# Patient Record
Sex: Female | Born: 1955 | Race: White | Hispanic: No | Marital: Married | Smoking: Never smoker
Health system: Southern US, Community
[De-identification: ages and names within clinical notes are randomized; demographics above are authoritative.]

## PROBLEM LIST (undated history)

## (undated) DIAGNOSIS — Z9889 Other specified postprocedural states: Secondary | ICD-10-CM

## (undated) DIAGNOSIS — K219 Gastro-esophageal reflux disease without esophagitis: Secondary | ICD-10-CM

## (undated) DIAGNOSIS — J45909 Unspecified asthma, uncomplicated: Secondary | ICD-10-CM

## (undated) DIAGNOSIS — R112 Nausea with vomiting, unspecified: Secondary | ICD-10-CM

## (undated) HISTORY — PX: ABDOMINAL HYSTERECTOMY: SHX81

## (undated) HISTORY — PX: KNEE SURGERY: SHX244

---

## 1999-08-16 ENCOUNTER — Other Ambulatory Visit: Admission: RE | Admit: 1999-08-16 | Discharge: 1999-08-16 | Payer: Self-pay | Admitting: Obstetrics and Gynecology

## 2000-11-25 ENCOUNTER — Encounter: Admission: RE | Admit: 2000-11-25 | Discharge: 2000-11-25 | Payer: Self-pay | Admitting: Obstetrics and Gynecology

## 2000-11-25 ENCOUNTER — Encounter: Payer: Self-pay | Admitting: Obstetrics and Gynecology

## 2000-11-27 ENCOUNTER — Other Ambulatory Visit: Admission: RE | Admit: 2000-11-27 | Discharge: 2000-11-27 | Payer: Self-pay | Admitting: Obstetrics and Gynecology

## 2001-01-06 ENCOUNTER — Encounter: Payer: Self-pay | Admitting: Obstetrics and Gynecology

## 2001-01-06 ENCOUNTER — Encounter: Admission: RE | Admit: 2001-01-06 | Discharge: 2001-01-06 | Payer: Self-pay | Admitting: Obstetrics and Gynecology

## 2003-05-29 ENCOUNTER — Encounter: Admission: RE | Admit: 2003-05-29 | Discharge: 2003-05-29 | Payer: Self-pay | Admitting: Obstetrics and Gynecology

## 2003-05-29 ENCOUNTER — Encounter: Payer: Self-pay | Admitting: Obstetrics and Gynecology

## 2004-09-09 ENCOUNTER — Ambulatory Visit (HOSPITAL_COMMUNITY): Admission: RE | Admit: 2004-09-09 | Discharge: 2004-09-09 | Payer: Self-pay | Admitting: Internal Medicine

## 2004-10-04 ENCOUNTER — Encounter: Admission: RE | Admit: 2004-10-04 | Discharge: 2004-10-04 | Payer: Self-pay | Admitting: Internal Medicine

## 2004-12-10 ENCOUNTER — Ambulatory Visit (HOSPITAL_COMMUNITY): Admission: RE | Admit: 2004-12-10 | Discharge: 2004-12-10 | Payer: Self-pay | Admitting: Gastroenterology

## 2005-10-31 ENCOUNTER — Encounter: Admission: RE | Admit: 2005-10-31 | Discharge: 2005-10-31 | Payer: Self-pay | Admitting: Internal Medicine

## 2006-10-26 ENCOUNTER — Encounter: Payer: Self-pay | Admitting: Vascular Surgery

## 2006-10-26 ENCOUNTER — Ambulatory Visit (HOSPITAL_COMMUNITY): Admission: RE | Admit: 2006-10-26 | Discharge: 2006-10-26 | Payer: Self-pay | Admitting: Specialist

## 2007-02-22 ENCOUNTER — Encounter: Admission: RE | Admit: 2007-02-22 | Discharge: 2007-02-22 | Payer: Self-pay | Admitting: Internal Medicine

## 2007-03-03 ENCOUNTER — Encounter: Admission: RE | Admit: 2007-03-03 | Discharge: 2007-03-03 | Payer: Self-pay | Admitting: Internal Medicine

## 2008-03-27 ENCOUNTER — Encounter: Admission: RE | Admit: 2008-03-27 | Discharge: 2008-03-27 | Payer: Self-pay | Admitting: Internal Medicine

## 2010-02-15 ENCOUNTER — Encounter: Admission: RE | Admit: 2010-02-15 | Discharge: 2010-02-15 | Payer: Self-pay | Admitting: Obstetrics and Gynecology

## 2010-02-28 ENCOUNTER — Encounter: Payer: Self-pay | Admitting: Internal Medicine

## 2010-03-04 ENCOUNTER — Ambulatory Visit: Payer: Self-pay | Admitting: Internal Medicine

## 2010-03-04 DIAGNOSIS — R05 Cough: Secondary | ICD-10-CM | POA: Insufficient documentation

## 2010-03-04 DIAGNOSIS — R059 Cough, unspecified: Secondary | ICD-10-CM | POA: Insufficient documentation

## 2010-03-04 LAB — CONVERTED CEMR LAB: IgE (Immunoglobulin E), Serum: 11.9 [IU]/mL (ref 0.0–180.0)

## 2010-03-05 ENCOUNTER — Telehealth (INDEPENDENT_AMBULATORY_CARE_PROVIDER_SITE_OTHER): Payer: Self-pay | Admitting: *Deleted

## 2010-03-19 ENCOUNTER — Ambulatory Visit: Payer: Self-pay | Admitting: Internal Medicine

## 2010-06-19 ENCOUNTER — Encounter: Admission: RE | Admit: 2010-06-19 | Discharge: 2010-06-19 | Payer: Self-pay | Admitting: Gastroenterology

## 2010-12-22 ENCOUNTER — Encounter: Payer: Self-pay | Admitting: Internal Medicine

## 2011-01-02 NOTE — Progress Notes (Signed)
Summary: instructions on Prilosec    [id number]  Phone Note Call from Patient Call back at Work Phone [phone]   Caller: Patient Call For: wert Reason for Call: Talk to Nurse Summary of Call: on pt's instructions, prilosec otc reads to take puffs, please clarify for pt.  pt goes by [facility] Initial call taken by: Eugene Gavia,  March 05, 2010 9:51 AM  Follow-up for Phone Call        pt was just seen by MW yesterday.  Per EMR, pt's current med list state Prilosec OTC 20mg   2 puffs first thing in the am and 2 puffs again in the pm about 12 hours later.  Please advise.  Thanks.  Aundra Millet Reynolds LPN  March 05, 980 10:02 AM   Pt requesting for [place] to call her back at work at [phone] or cell [phone].  Gweneth Dimitri RN  March 05, 2010 11:21 AM sorry, wrong quicktext!  should read Take one 30-60 min before first and last meals of the day   Follow-up by: Nyoka Cowden MD,  March 05, 2010 11:56 AM  Additional Follow-up for Phone Call Additional follow up Details #1::        ATC pt at work #.  Line busy.  LM on cell # TCB.  Aundra Millet Reynolds LPN  March 05, 9561 2:25 PM   called and spoke with pt and informed her of the correct directions of her prilosec.  pt verbalized understanding and denied any questions.  i have updated pt's med list to refect this change. Aundra Millet Reynolds LPN  March 06, 1307 4:09 PM     New/Updated Medications: PRILOSEC OTC 20 MG TBEC (OMEPRAZOLE MAGNESIUM) take 1 tab by mouth two times a day 30 to 60 minutes before first and last meal of the day.

## 2011-01-02 NOTE — Assessment & Plan Note (Signed)
Summary: Pulmonayr/ f/u cough, add 1st gen H1   Copy to:  Dr. Renford Dills Primary Provider/Referring Provider:  Dr. Renford Dills   CC:  2 wk followup.  Pt states that her cough is at least 98% better.  She only has occ dry cough.  No new complaints today.Wendy Cain  History of Present Illness: 55 yowf never smoker with h/o seasonal itching sneezing runny eyes spring = fall with pattern worsening since around 1995 assoc with cough/ wheezing and sob with periods feeling better  in between on no medicines but finds zyrtec helps.  When flares typically to prednisone and abx but always feels a little ticke or fullness in the throat since 2002 or so.  March 04, 2010 cc cough much worse than baseline  since early March 2011 ? related to cigarettes  rx with abx, then prednisone helped some then started Advair x 1 week.  Fatigue. Did have nasty mucus now does not. more day than night  and barking quality cough with midline pain during severe fits only and takes her breath away but not gagging or vomiting. Advair makes her cough worse.  March 19, 2010 2 wk followup.  Pt states that her cough is at least 98% better.  She only has occ dry cough.  No new complaints today. tickle went away with prednisone and now back while taking prilosec two times a day and not pepcid and using zyrtec.  more noticeable lying down. Pt denies any significant sore throat, dysphagia, itching, sneezing,  nasal congestion or excess secretions,  fever, chills, sweats, unintended wt loss, pleuritic or exertional cp, hempoptysis, change in activity tolerance  orthopnea pnd or leg swelling Pt also denies any obvious fluctuation in symptoms with weather or environmental change or other alleviating or aggravating factors.           Current Medications (verified): 1)  Ventolin Hfa 108 (90 Base) Mcg/act Aers (Albuterol Sulfate) .Wendy Cain.. 1 Puff Two Times A Day 2)  Prilosec Otc 20 Mg Tbec (Omeprazole Magnesium) .... Take 1 Tab By Mouth Two Times A  Day 30 To 60 Minutes Before First and Last Meal of The Day. 3)  Tramadol Hcl 50 Mg  Tabs (Tramadol Hcl) .... One To Two By Mouth Every 4-6 Hours As Needed  Allergies (verified): No Known Drug Allergies  Past History:  Past Medical History: COUGH (ICD-786.2)..............................Wendy KitchenWert     - onset around 2002     - allergy profile neg 03/04/10  Vital Signs:  Patient profile:   55 year old female Weight:      136.13 pounds O2 Sat:      97 % on Room air Temp:     98.2 degrees F oral Pulse rate:   78 / minute BP sitting:   140 / 74  (left arm)  Vitals Entered By: Vernie Murders (March 19, 2010 10:09 AM)  O2 Flow:  Room air  Physical Exam  Additional Exam:  pleasant amb wf with occ throat clearing wt 136 March 04, 2010  > 136 March 19, 2010  HEENT: nl dentition, turbinates, and orophanx. Nl external ear canals without cough reflex NECK :  without JVD/Nodes/TM/ nl carotid upstrokes bilaterally LUNGS: no acc muscle use, clear to A and P bilaterally without cough on insp or exp maneuvers CV:  RRR  no s3 or murmur or increase in P2, no edema  ABD:  soft and nontender with nl excursion in the supine position. No bruits or organomegaly, bowel sounds nl  MS:  warm without deformities, calf tenderness, cyanosis or clubbing    Impression & Recommendations:  Problem # 1:  COUGH (ICD-786.2)  The most common causes of chronic cough in immunocompetent adults include: upper airway cough syndrome (UACS), previously referred to as postnasal drip syndrome,  caused by variety of rhinosinus conditions; (2) asthma; (3) GERD; (4) chronic bronchitis from cigarette smoking or other inhaled environmental irritants; (5) nonasthmatic eosinophilic bronchitis; and (6) bronchiectasis. These conditions, singly or in combination, have accounted for up to 94% of the causes of chronic cough in prospective studies.   The standardized cough guidelines recently published in Chest are a 14 step process, not a  single office visit,  and are intended  to address this problem logically,  with an alogrithm dependent on response to each progressive step  to determine a specific diagnosis with  minimal addtional testing needed. Therefore if compliance is an issue this empiric standardized approach simply won't work.  next step is add H1 first gen and work harder on acid suppression, next step Methacholine while on max acid rx.  Orders: Est. Patient Level III ([address])  Medications Added to Medication List This Visit: 1)  Prednisone 10 Mg Tabs (Prednisone) .... 4 each am x 2days, 2x2days, 1x2days and stop 2)  Ventolin Hfa 108 (90 Base) Mcg/act Aers (Albuterol sulfate) .... 2 puffs every 4 hours if needed for short of breath  Patient Instructions: 1)  GERD (REFLUX)  is a common cause of respiratory symptoms. It commonly presents without heartburn and can be treated with medication, but also with lifestyle changes including avoidance of late meals, excessive alcohol, smoking cessation, and avoid fatty foods, chocolate, peppermint, colas, red wine, and acidic juices such as orange juice. NO MINT OR MENTHOL PRODUCTS SO NO COUGH DROPS  2)  USE SUGARLESS CANDY INSTEAD (jolley ranchers)  3)  NO OIL BASED VITAMINS 4)  Acid reflux is a leading suspect here and needs to be eliminated  completely before considering additional studies or treatment options. To suppress this maximally, take Prilosec  before first and last meal and pepcid 20 mg (otc) at bedtime plus diet measures as listed.  5)  Prednisone  4 each am x 2days, 2x2days, 1x2days and stop  6)  Chortrimeton 4mg  every 6 hours as needed for tickle itching sneezing drainage 7)  Take delsym two tsp every 12 hours and add tramadol 50 mg up to every 4 hours to suppress the urge to cough. Swallowing water or using ice chips/non mint and menthol containing candies (such as lifesavers or sugarless jolly ranchers) are also effective.  8)  Please schedule a follow-up  appointment in 2 weeks, sooner if needed  Prescriptions: PREDNISONE 10 MG  TABS (PREDNISONE) 4 each am x 2days, 2x2days, 1x2days and stop  #14 x 0   Entered and Authorized by:   Nyoka Cowden MD   Signed by:   Nyoka Cowden MD on 03/19/2010   Method used:   Electronically to        [facility] [address] [id number]* (retail)       [address] [place], [place]  [address]       Ph: [phone]       Fax: [phone]   RxID:   [id number]

## 2011-01-02 NOTE — Assessment & Plan Note (Signed)
Summary: Pulmonary consulation/ cough c/w UACS/GERD   Visit Type:  Initial Consult Copy to:  Dr. Renford Dills Primary Provider/Referring Provider:  Dr. Renford Dills   CC:  Cough.  History of Present Illness: 55 yowf never smoker with h/o seasonal itching sneezing runny eyes spring = fall with pattern worsening since around 1995 assoc with cough/ wheezing and sob with periods feeling better  in between on no medicines but finds zyrtec helps.  When flares typically to prednisone and abx but always feels a little ticke or fullness in the throat since 2002 or so.  March 04, 2010 cc cough much worse than baseline  since early March 16109 ? related to cigarettes  rx with abx, then prednisone helped some then started Advair x 1 week.  Fatigue. Did have nasty mucus now does not. more day than night  and barking quality cough with midline pain during severe fits only and takes her breath away but not gagging or vomiting. Advair makes her cough worse.    Pt presently denies any significant sore throat, dysphagia, itching, sneezing,  nasal congestion or excess secretions,  fever, chills, sweats, unintended wt loss, pleuritic or exertional cp, hempoptysis, change in activity tolerance  orthopnea pnd or leg swelling Pt also denies any obvious fluctuation in symptoms with weather or environmental change or other alleviating or aggravating factors.        Current Medications (verified): 1)  Ventolin Hfa 108 (90 Base) Mcg/act Aers (Albuterol Sulfate) .Marland Kitchen.. 1 Puff Two Times A Day 2)  Advair Diskus 100-50 Mcg/dose Aepb (Fluticasone-Salmeterol) .Marland Kitchen.. 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  The patient denies productive cough, coughing up blood, irregular heartbeats, acid heartburn, indigestion, loss of appetite, weight change, abdominal pain, difficulty swallowing, sore throat, tooth/dental problems, nasal congestion/difficulty breathing through nose, sneezing, itching, ear ache, anxiety, depression, hand/feet swelling, joint stiffness or pain, rash, change in color of mucus, and fever.    Vital Signs:  Patient profile:   55 year old female Height:      64 inches Weight:      136 pounds BMI:     23.43 O2 Sat:      98 % on Room air Temp:     98.1 degrees F oral Pulse rate:   73 / minute BP sitting:   114 / 66  (left arm)  Vitals Entered By: Vernie Murders (March 04, 2010 9:59 AM)  O2 Flow:  Room air  Physical Exam  Additional Exam:  pleasant amb wf with barking/honking quality upper airway cough wt 136 March 04, 2010 HEENT: nl dentition, turbinates, and orophanx. Nl external ear canals without cough reflex NECK :  without JVD/Nodes/TM/ nl carotid upstrokes bilaterally LUNGS: no acc muscle use, clear to A and P bilaterally without cough on insp or exp maneuvers CV:  RRR  no s3 or murmur or increase in P2, no edema  ABD:  soft and nontender with nl excursion in the supine position. No bruits or organomegaly, bowel sounds nl MS:  warm without  deformities, calf tenderness, cyanosis or clubbing SKIN: warm and dry without lesions   NEURO:  alert, approp, no deficits     Impression & Recommendations:  Problem # 1:  COUGH (ICD-786.2)  Orders: T-Allergy Profile [facility], [place], [place], [place], [place] [phone]) Consultation Level V [phone])  The most common causes of chronic cough in immunocompetent adults include: upper airway cough syndrome (UACS), previously referred to as postnasal drip syndrome,  caused by variety  of rhinosinus conditions; (2) asthma; (3) GERD; (4) chronic bronchitis from cigarette smoking or other inhaled environmental irritants; (5) nonasthmatic eosinophilic bronchitis; and (6) bronchiectasis. These conditions, singly or in combination, have accounted for up to 94% of the causes of chronic cough in prospective studies.   Of the three most common causes of chronic cough, only one (GERD)  can actually cause the other two (asthma and uacs)  and perpetuate the cylce of cough inducing airway trauma, inflammation, heightened sensitivity to reflux which is prompted by the cough itself via a cyclical mechanism.  This may partially respond to steroids and look like asthma and post nasal drainage but never erradicated completely unless the cough and the secondary reflux are eliminated, preferably both at the same time. See instructions for specific recommendations   The standardized cough guidelines recently published in Chest are a 14 step process, not a single office visit,  and are intended  to address this problem logically,  with an alogrithm dependent on response to each progressive step  to determine a specific diagnosis with  minimal addtional testing needed. Therefore if compliance is an issue this empiric standardized approach simply won't work.   NB the  ramp to expected improvement (and for that matter, worsening, if a chronic effective medication is stopped)  can be measured in weeks, not days, a common misconception because this is not Heartburn with no immediate cause and effect relationship so that response to therapy or lack thereof can be very difficult to assess.   Medications Added to Medication List This Visit: 1)  Ventolin Hfa 108 (90 Base) Mcg/act Aers (Albuterol sulfate) .Marland Kitchen.. 1 puff two times a day 2)  Advair Diskus 100-50 Mcg/dose Aepb (Fluticasone-salmeterol) .Marland Kitchen.. 1 puff two times a day 3)  Prilosec Otc 20 Mg Tbec (Omeprazole magnesium) .... 2 puffs first thing  in am and 2 puffs  again in pm about 12 hours later 4)  Prednisone 10 Mg Tabs (Prednisone) .... 4 each am x 2days, 2x2days, 1x2days and stop 5)  Tramadol Hcl 50 Mg Tabs (Tramadol hcl) .... One to two by mouth every 4-6 hours as needed  Patient Instructions: 1)  GERD (REFLUX)  is a common cause of respiratory symptoms. It commonly presents without heartburn and can be treated with medication, but also with lifestyle changes including avoidance of late meals, excessive alcohol, smoking cessation, and avoid fatty foods, chocolate, peppermint, colas, red wine, and acidic juices such as orange juice. NO MINT OR MENTHOL PRODUCTS SO NO COUGH DROPS  2)  USE SUGARLESS CANDY INSTEAD (jolley ranchers)  3)  NO OIL BASED VITAMINS 4)  Acid reflux is a leading suspect here and needs to be eliminated  completely before considering additional studies or treatment options. To suppress this maximally, take Prilosec  before first and last meal and pepcid 20 mg (otc) at bedtime plus diet measures as listed.  5)  Stop advair 6)  Prednisone  4 each am x 2days, 2x2days, 1x2days and stop  7)  Take delsym two tsp every 12 hours  and add tramadol 50 mg up to every 4 hours to suppress the urge to cough. Swallowing water or using ice chips/non mint and menthol containing candies (such as lifesavers or sugarless jolly ranchers) are also effective.  8)  Please schedule a follow-up appointment in 2 weeks, sooner if needed  Prescriptions: TRAMADOL HCL 50 MG  TABS (TRAMADOL HCL) One to two by mouth every 4-6 hours as needed  #40 x 0   Entered and Authorized by:   Nyoka Cowden MD   Signed by:   Nyoka Cowden MD on 03/04/2010   Method used:   Electronically to        [facility] [id number]* (retail)       [address] [place], [place]  [address]       Ph: [phone]       Fax: [phone]   RxID:   [id number] PREDNISONE 10 MG  TABS (PREDNISONE) 4 each am x 2days, 2x2days, 1x2days and stop  #14 x 0   Entered and Authorized  by:   Nyoka Cowden MD   Signed by:   Nyoka Cowden MD on 03/04/2010   Method used:   Electronically to        [facility] [id number]* (retail)       [address] [place], [place]  [address]       Ph: [phone]       Fax: [phone]   RxID:   [id number]   Appended Document: Pulmonary consulation/ cough c/w UACS/GERD reported cxr at [facility] radiology nl w/in past 6 weeks but not in system

## 2011-01-31 ENCOUNTER — Other Ambulatory Visit: Payer: Self-pay | Admitting: Obstetrics and Gynecology

## 2011-01-31 DIAGNOSIS — Z1239 Encounter for other screening for malignant neoplasm of breast: Secondary | ICD-10-CM

## 2011-02-18 ENCOUNTER — Ambulatory Visit
Admission: RE | Admit: 2011-02-18 | Discharge: 2011-02-18 | Disposition: A | Discharge status: Home / Self Care | Payer: 59 | Source: Ambulatory Visit | Attending: Obstetrics and Gynecology | Admitting: Obstetrics and Gynecology

## 2011-02-18 DIAGNOSIS — Z1239 Encounter for other screening for malignant neoplasm of breast: Secondary | ICD-10-CM

## 2011-04-18 NOTE — Op Note (Signed)
NAME:  Wendy Cain, Wendy Cain                ACCOUNT NO.:  [id number]   MEDICAL RECORD NO.:  [id number]          PATIENT TYPE:  AMB   LOCATION:  ENDO                         FACILITY:  [facility]   PHYSICIAN:  Danise Edge, M.D.   DATE OF BIRTH:  1956-03-29   DATE OF PROCEDURE:  12/10/2004  DATE OF DISCHARGE:                                 OPERATIVE REPORT   PROCEDURE:  Screening colonoscopy.   INDICATIONS FOR PROCEDURE:  Ms. [name] is a  55 year old female born  [birth year].  Ms. [name] is scheduled to undergo her first screening  colonoscopy with polypectomy to prevent colon cancer.   ENDOSCOPIST:  Danise Edge, M.D.   PREMEDICATION:  Versed 5 mg, Demerol 50 mg.   DESCRIPTION OF PROCEDURE:  After obtaining informed consent, Ms. [name] was  placed in the left lateral decubitus position.  I administered intravenous  Demerol and intravenous Versed to achieve conscious sedation for the  procedure.  The patient's blood pressure, oxygen saturation, and cardiac  rhythm were monitored throughout the procedure and documented in the medical  record.   Anal inspection and digital rectal exam were normal.  The Olympus adjustable  pediatric colonoscope was introduced into the rectum and advanced to the  cecum.  The colonic preparation for the exam today was excellent.   Rectum:  Normal.   Sigmoid colon and descending colon:  Normal.   Splenic flexure:  Normal.   Transverse colon:  Normal.   Hepatic flexure:  Normal.   Ascending colon:  Normal.   Cecum and ileocecal valve:  Normal.   ASSESSMENT:  Normal screening proctocolonoscopy to the cecum.  No endoscopic  evidence for the presence of colorectal neoplasia.      MJ/MEDQ  D:  12/10/2004  T:  12/10/2004  Job:  [id number]   cc:   Ike Bene, M.D.  [address]  [place]  [place] [address]  Fax: [phone]

## 2012-01-13 ENCOUNTER — Other Ambulatory Visit: Payer: Self-pay | Admitting: Obstetrics and Gynecology

## 2012-01-13 DIAGNOSIS — Z1231 Encounter for screening mammogram for malignant neoplasm of breast: Secondary | ICD-10-CM

## 2012-02-27 ENCOUNTER — Ambulatory Visit: Payer: 59

## 2012-02-27 ENCOUNTER — Ambulatory Visit
Admission: RE | Admit: 2012-02-27 | Discharge: 2012-02-27 | Disposition: A | Discharge status: Home / Self Care | Payer: 59 | Source: Ambulatory Visit | Attending: Obstetrics and Gynecology | Admitting: Obstetrics and Gynecology

## 2012-02-27 DIAGNOSIS — Z1231 Encounter for screening mammogram for malignant neoplasm of breast: Secondary | ICD-10-CM

## 2012-03-01 ENCOUNTER — Ambulatory Visit: Payer: 59

## 2012-11-01 ENCOUNTER — Other Ambulatory Visit: Payer: Self-pay | Admitting: Obstetrics and Gynecology

## 2012-11-01 DIAGNOSIS — M8580 Other specified disorders of bone density and structure, unspecified site: Secondary | ICD-10-CM

## 2013-03-04 ENCOUNTER — Other Ambulatory Visit: Payer: Self-pay

## 2013-03-04 ENCOUNTER — Other Ambulatory Visit: Payer: Self-pay | Admitting: Obstetrics and Gynecology

## 2013-03-04 DIAGNOSIS — M8580 Other specified disorders of bone density and structure, unspecified site: Secondary | ICD-10-CM

## 2013-03-04 DIAGNOSIS — Z1231 Encounter for screening mammogram for malignant neoplasm of breast: Secondary | ICD-10-CM

## 2013-04-15 ENCOUNTER — Ambulatory Visit
Admission: RE | Admit: 2013-04-15 | Discharge: 2013-04-15 | Disposition: A | Discharge status: Home / Self Care | Payer: BC Managed Care – PPO | Source: Ambulatory Visit | Attending: Obstetrics and Gynecology | Admitting: Obstetrics and Gynecology

## 2013-04-15 ENCOUNTER — Ambulatory Visit
Admission: RE | Admit: 2013-04-15 | Discharge: 2013-04-15 | Disposition: A | Discharge status: Home / Self Care | Payer: BC Managed Care – PPO | Source: Ambulatory Visit

## 2013-04-15 DIAGNOSIS — M8580 Other specified disorders of bone density and structure, unspecified site: Secondary | ICD-10-CM

## 2013-04-15 DIAGNOSIS — Z1231 Encounter for screening mammogram for malignant neoplasm of breast: Secondary | ICD-10-CM

## 2014-04-14 ENCOUNTER — Other Ambulatory Visit: Payer: Self-pay

## 2014-04-14 DIAGNOSIS — Z1231 Encounter for screening mammogram for malignant neoplasm of breast: Secondary | ICD-10-CM

## 2014-04-25 ENCOUNTER — Encounter (INDEPENDENT_AMBULATORY_CARE_PROVIDER_SITE_OTHER): Payer: Self-pay

## 2014-04-25 ENCOUNTER — Ambulatory Visit
Admission: RE | Admit: 2014-04-25 | Discharge: 2014-04-25 | Disposition: A | Discharge status: Home / Self Care | Payer: BC Managed Care – PPO | Source: Ambulatory Visit

## 2014-04-25 DIAGNOSIS — Z1231 Encounter for screening mammogram for malignant neoplasm of breast: Secondary | ICD-10-CM

## 2015-04-06 ENCOUNTER — Other Ambulatory Visit: Payer: Self-pay

## 2015-04-06 DIAGNOSIS — Z1231 Encounter for screening mammogram for malignant neoplasm of breast: Secondary | ICD-10-CM

## 2015-04-27 ENCOUNTER — Ambulatory Visit
Admission: RE | Admit: 2015-04-27 | Discharge: 2015-04-27 | Disposition: A | Discharge status: Home / Self Care | Payer: BLUE CROSS/BLUE SHIELD | Source: Ambulatory Visit

## 2015-04-27 DIAGNOSIS — Z1231 Encounter for screening mammogram for malignant neoplasm of breast: Secondary | ICD-10-CM

## 2015-11-01 ENCOUNTER — Other Ambulatory Visit: Payer: Self-pay | Admitting: Obstetrics and Gynecology

## 2015-11-01 DIAGNOSIS — M8580 Other specified disorders of bone density and structure, unspecified site: Secondary | ICD-10-CM

## 2015-12-28 ENCOUNTER — Ambulatory Visit
Admission: RE | Admit: 2015-12-28 | Discharge: 2015-12-28 | Disposition: A | Discharge status: Home / Self Care | Payer: BLUE CROSS/BLUE SHIELD | Source: Ambulatory Visit | Attending: Obstetrics and Gynecology | Admitting: Obstetrics and Gynecology

## 2015-12-28 DIAGNOSIS — M8580 Other specified disorders of bone density and structure, unspecified site: Secondary | ICD-10-CM

## 2016-03-19 ENCOUNTER — Other Ambulatory Visit: Payer: Self-pay

## 2016-03-19 ENCOUNTER — Other Ambulatory Visit: Payer: Self-pay | Admitting: Gastroenterology

## 2016-03-19 DIAGNOSIS — Z1231 Encounter for screening mammogram for malignant neoplasm of breast: Secondary | ICD-10-CM

## 2016-03-24 ENCOUNTER — Encounter (HOSPITAL_COMMUNITY): Payer: Self-pay | Admitting: *Deleted

## 2016-04-01 ENCOUNTER — Ambulatory Visit (HOSPITAL_COMMUNITY): Payer: BLUE CROSS/BLUE SHIELD | Admitting: Certified Registered Nurse Anesthetist

## 2016-04-01 ENCOUNTER — Ambulatory Visit (HOSPITAL_COMMUNITY)
Admission: RE | Admit: 2016-04-01 | Discharge: 2016-04-01 | Disposition: A | Discharge status: Home / Self Care | Payer: BLUE CROSS/BLUE SHIELD | Source: Ambulatory Visit | Attending: Gastroenterology | Admitting: Gastroenterology

## 2016-04-01 ENCOUNTER — Encounter (HOSPITAL_COMMUNITY): Payer: Self-pay | Admitting: *Deleted

## 2016-04-01 ENCOUNTER — Encounter (HOSPITAL_COMMUNITY)
Admission: RE | Disposition: A | Discharge status: Home / Self Care | Payer: Self-pay | Source: Ambulatory Visit | Attending: Gastroenterology

## 2016-04-01 DIAGNOSIS — Z1211 Encounter for screening for malignant neoplasm of colon: Secondary | ICD-10-CM | POA: Diagnosis not present

## 2016-04-01 DIAGNOSIS — Z800 Family history of malignant neoplasm of digestive organs: Secondary | ICD-10-CM | POA: Insufficient documentation

## 2016-04-01 HISTORY — DX: Other specified postprocedural states: Z98.890

## 2016-04-01 HISTORY — DX: Unspecified asthma, uncomplicated: J45.909

## 2016-04-01 HISTORY — DX: Gastro-esophageal reflux disease without esophagitis: K21.9

## 2016-04-01 HISTORY — PX: COLONOSCOPY WITH PROPOFOL: SHX5780

## 2016-04-01 HISTORY — DX: Nausea with vomiting, unspecified: R11.2

## 2016-04-01 SURGERY — COLONOSCOPY WITH PROPOFOL
Anesthesia: Monitor Anesthesia Care

## 2016-04-01 MED ORDER — PROPOFOL 500 MG/50ML IV EMUL
INTRAVENOUS | Status: DC | PRN
Start: 2016-04-01 — End: 2016-04-01
  Administered 2016-04-01: 100 ug/kg/min via INTRAVENOUS

## 2016-04-01 MED ORDER — SODIUM CHLORIDE 0.9 % IV SOLN: INTRAVENOUS | Status: DC

## 2016-04-01 MED ORDER — PROPOFOL 500 MG/50ML IV EMUL
INTRAVENOUS | Status: DC | PRN
  Administered 2016-04-01 (×2): 30 mg via INTRAVENOUS

## 2016-04-01 MED ORDER — PROPOFOL 10 MG/ML IV BOLUS
INTRAVENOUS | Status: AC
  Filled 2016-04-01: qty 20

## 2016-04-01 MED ORDER — PROPOFOL 10 MG/ML IV BOLUS
INTRAVENOUS | Status: AC
  Filled 2016-04-01: qty 40

## 2016-04-01 MED ORDER — LACTATED RINGERS IV SOLN
INTRAVENOUS | Status: DC
  Administered 2016-04-01: 1000 mL via INTRAVENOUS

## 2016-04-01 SURGICAL SUPPLY — 22 items
ELECT REM PT RETURN 9FT ADLT (ELECTROSURGICAL) IMPLANT
ELECTRODE REM PT RTRN 9FT ADLT (ELECTROSURGICAL) IMPLANT
FCP BXJMBJMB 240X2.8X (CUTTING FORCEPS)
FLOOR PAD 36X40 (MISCELLANEOUS) ×2 IMPLANT
FORCEPS BIOP RAD 4 LRG CAP 4 (CUTTING FORCEPS) IMPLANT
FORCEPS BIOP RJ4 240 W/NDL (CUTTING FORCEPS) IMPLANT
FORCEPS BXJMBJMB 240X2.8X (CUTTING FORCEPS) IMPLANT
INJECTOR/SNARE I SNARE (MISCELLANEOUS) IMPLANT
LUBRICANT JELLY 4.5OZ STERILE (MISCELLANEOUS) IMPLANT
MANIFOLD NEPTUNE II (INSTRUMENTS) IMPLANT
NDL SCLEROTHERAPY 25GX240 (NEEDLE) IMPLANT
NEEDLE SCLEROTHERAPY 25GX240 (NEEDLE) IMPLANT
PAD FLOOR 36X40 (MISCELLANEOUS) ×1 IMPLANT
PROBE APC STR FIRE (PROBE) IMPLANT
PROBE INJECTION GOLD (MISCELLANEOUS)
PROBE INJECTION GOLD 7FR (MISCELLANEOUS) IMPLANT
SNARE ROTATE MED OVAL 20MM (MISCELLANEOUS) IMPLANT
SYR 50ML LL SCALE MARK (SYRINGE) IMPLANT
TRAP SPECIMEN MUCOUS 40CC (MISCELLANEOUS) IMPLANT
TUBING ENDO SMARTCAP PENTAX (MISCELLANEOUS) IMPLANT
TUBING IRRIGATION ENDOGATOR (MISCELLANEOUS) ×2 IMPLANT
WATER STERILE IRR 1000ML POUR (IV SOLUTION) IMPLANT

## 2016-04-01 NOTE — H&P (Signed)
  Procedure: Screening colonoscopy. Sister diagnosed with colon cancer. Normal screening colonoscopies were performed on 12/10/2004 and 06/18/2010  History: The patient is a 60 year old female born 06-24-1956. She is scheduled to undergo a repeat screening colonoscopy today  Medication allergies: None  Past medical history: Asthma. Hysterectomy to treat fibroid tumors.  Family history: Sister diagnosed with colon cancer  Exam: The patient is alert and lying comfortably on the endoscopy stretcher. Abdomen is soft and nontender to palpation. Lungs are clear to auscultation. Cardiac exam reveals a regular rhythm.  Plan: Proceed with screening colonoscopy

## 2016-04-01 NOTE — Transfer of Care (Signed)
Immediate Anesthesia Transfer of Care Note  Patient: Wendy Cain  Procedure(s) Performed: Procedure(s): COLONOSCOPY WITH PROPOFOL (N/A)  Patient Location: PACU  Anesthesia Type:MAC  Level of Consciousness: awake, alert  and oriented  Airway & Oxygen Therapy: Patient Spontanous Breathing and Patient connected to face mask oxygen  Post-op Assessment: Report given to RN and Post -op Vital signs reviewed and stable  Post vital signs: Reviewed and stable  Last Vitals:  Filed Vitals:   04/01/16 1159  BP: 131/47  Pulse: 72  Temp: 36.6 C  Resp: 8    Last Pain: There were no vitals filed for this visit.       Complications: No apparent anesthesia complications

## 2016-04-01 NOTE — Anesthesia Postprocedure Evaluation (Signed)
Anesthesia Post Note  Patient: Wendy Cain  Procedure(s) Performed: Procedure(s) (LRB): COLONOSCOPY WITH PROPOFOL (N/A)  Patient location during evaluation: Endoscopy Anesthesia Type: MAC Level of consciousness: awake and alert Pain management: pain level controlled Vital Signs Assessment: post-procedure vital signs reviewed and stable Respiratory status: spontaneous breathing, nonlabored ventilation, respiratory function stable and patient connected to nasal cannula oxygen Cardiovascular status: stable and blood pressure returned to baseline Anesthetic complications: no    Last Vitals:  Filed Vitals:   04/01/16 1400 04/01/16 1410  BP: 116/52 119/73  Pulse: 63 71  Temp:    Resp: 10 19    Last Pain: There were no vitals filed for this visit.               Effie Berkshire

## 2016-04-01 NOTE — Op Note (Addendum)
[facility] Patient Name: Wendy Cain Procedure Date: 04/01/2016 MRN: [id number] Attending MD: Garlan Fair , MD Date of Birth: Jan 22, 1956 CSN: [id number] Age: 60 Admit Type: Outpatient Procedure:                Colonoscopy Indications:              Screening in patient at increased risk: Colorectal                            cancer in sister before age 40 Providers:                Garlan Fair, MD, Laverta Baltimore, RN, Cletis Athens, Technician Referring MD:              Medicines:                Propofol per Anesthesia Complications:            No immediate complications. Estimated Blood Loss:     Estimated blood loss: none. Procedure:                Pre-Anesthesia Assessment:                           - Prior to the procedure, a History and Physical                            was performed, and patient medications and                            allergies were reviewed. The patient's tolerance of                            previous anesthesia was also reviewed. The risks                            and benefits of the procedure and the sedation                            options and risks were discussed with the patient.                            All questions were answered, and informed consent                            was obtained. Prior Anticoagulants: The patient has                            taken no previous anticoagulant or antiplatelet                            agents. ASA Grade Assessment: II - A patient with  mild systemic disease. After reviewing the risks                            and benefits, the patient was deemed in                            satisfactory condition to undergo the procedure.                           After obtaining informed consent, the colonoscope                            was passed under direct vision. Throughout the                            procedure, the  patient's blood pressure, pulse, and                            oxygen saturations were monitored continuously. The                            [id number] scope was introduced through                            the anus and advanced to the the cecum, identified                            by appendiceal orifice and ileocecal valve. The                            colonoscopy was performed without difficulty. The                            patient tolerated the procedure well. The quality                            of the bowel preparation was good. The ileocecal                            valve, the appendiceal orifice and the rectum were                            photographed. Scope In: 1:30:59 PM Scope Out: 1:46:33 PM Scope Withdrawal Time: 0 hours 10 minutes 9 seconds  Total Procedure Duration: 0 hours 15 minutes 34 seconds  Findings:      The perianal and digital rectal examinations were normal.      The entire examined colon appeared normal. Impression:               - The entire examined colon is normal.                           - No specimens collected. Moderate Sedation:      N/A- Per Anesthesia Care Recommendation:           -  Patient has a contact number available for                            emergencies. The signs and symptoms of potential                            delayed complications were discussed with the                            patient. Return to normal activities tomorrow.                            Written discharge instructions were provided to the                            patient.                           - Repeat colonoscopy in 5 years for screening                            purposes.                           - Resume previous diet.                           - Continue present medications. Procedure Code(s):        --- Professional ---                           [id number], Colorectal cancer screening; colonoscopy on                             individual at high risk Diagnosis Code(s):        --- Professional ---                           Z80.0, Family history of malignant neoplasm of                            digestive organs CPT copyright 2016 [employer]. All rights reserved. The codes documented in this report are preliminary and upon coder review may  be revised to meet current compliance requirements. Earle Gell, MD Garlan Fair, MD 04/01/2016 1:51:23 PM This report has been signed electronically. Number of Addenda: 0

## 2016-04-01 NOTE — Anesthesia Preprocedure Evaluation (Addendum)
Anesthesia Evaluation  Patient identified by MRN, date of birth, ID band Patient awake    Reviewed: Allergy & Precautions, NPO status , Patient's Chart, lab work & pertinent test results  History of Anesthesia Complications (+) PONV and history of anesthetic complications  Airway Mallampati: I  TM Distance: >3 FB Neck ROM: Full    Dental  (+) Teeth Intact, Dental Advisory Given   Pulmonary asthma ,    breath sounds clear to auscultation       Cardiovascular negative cardio ROS   Rhythm:Regular Rate:Normal     Neuro/Psych negative neurological ROS  negative psych ROS   GI/Hepatic Neg liver ROS, GERD  Medicated,  Endo/Other  negative endocrine ROS  Renal/GU negative Renal ROS  negative genitourinary   Musculoskeletal negative musculoskeletal ROS (+)   Abdominal   Peds negative pediatric ROS (+)  Hematology negative hematology ROS (+)   Anesthesia Other Findings   Reproductive/Obstetrics negative OB ROS                            Anesthesia Physical Anesthesia Plan  ASA: II  Anesthesia Plan: MAC   Post-op Pain Management:    Induction: Intravenous  Airway Management Planned: Simple Face Mask  Additional Equipment:   Intra-op Plan:   Post-operative Plan:   Informed Consent: I have reviewed the patients History and Physical, chart, labs and discussed the procedure including the risks, benefits and alternatives for the proposed anesthesia with the patient or authorized representative who has indicated his/her understanding and acceptance.     Plan Discussed with: CRNA  Anesthesia Plan Comments:         Anesthesia Quick Evaluation

## 2016-04-01 NOTE — Discharge Instructions (Signed)

## 2016-04-02 ENCOUNTER — Encounter (HOSPITAL_COMMUNITY): Payer: Self-pay | Admitting: Gastroenterology

## 2016-04-29 ENCOUNTER — Ambulatory Visit
Admission: RE | Admit: 2016-04-29 | Discharge: 2016-04-29 | Disposition: A | Discharge status: Home / Self Care | Payer: BLUE CROSS/BLUE SHIELD | Source: Ambulatory Visit

## 2016-04-29 ENCOUNTER — Ambulatory Visit: Payer: BLUE CROSS/BLUE SHIELD

## 2016-04-29 DIAGNOSIS — Z1231 Encounter for screening mammogram for malignant neoplasm of breast: Secondary | ICD-10-CM

## 2016-11-05 ENCOUNTER — Other Ambulatory Visit: Payer: Self-pay | Admitting: Obstetrics and Gynecology

## 2016-11-05 DIAGNOSIS — N644 Mastodynia: Secondary | ICD-10-CM

## 2016-11-15 ENCOUNTER — Encounter (HOSPITAL_COMMUNITY): Payer: Self-pay | Admitting: Nurse Practitioner

## 2016-11-15 DIAGNOSIS — J45909 Unspecified asthma, uncomplicated: Secondary | ICD-10-CM | POA: Diagnosis not present

## 2016-11-15 DIAGNOSIS — R1013 Epigastric pain: Secondary | ICD-10-CM | POA: Diagnosis not present

## 2016-11-15 LAB — CBC
HCT: 37.5 % (ref 36.0–46.0)
Hemoglobin: 12.6 g/dL (ref 12.0–15.0)
MCH: 31.7 pg (ref 26.0–34.0)
MCHC: 33.6 g/dL (ref 30.0–36.0)
MCV: 94.5 fL (ref 78.0–100.0)
Platelets: 352 10*3/uL (ref 150–400)
RBC: 3.97 MIL/uL (ref 3.87–5.11)
RDW: 12.7 % (ref 11.5–15.5)
WBC: 11.3 10*3/uL — ABNORMAL HIGH (ref 4.0–10.5)
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
## 2016-11-16 ENCOUNTER — Emergency Department (HOSPITAL_COMMUNITY)
Admission: EM | Admit: 2016-11-16 | Discharge: 2016-11-16 | Disposition: A | Discharge status: Home / Self Care | Payer: 59 | Attending: Emergency Medicine | Admitting: Emergency Medicine

## 2016-11-16 DIAGNOSIS — R1013 Epigastric pain: Secondary | ICD-10-CM

## 2016-11-16 LAB — URINALYSIS, ROUTINE W REFLEX MICROSCOPIC
Bacteria, UA: NONE SEEN
Bilirubin Urine: NEGATIVE
Glucose, UA: NEGATIVE mg/dL
Ketones, ur: NEGATIVE mg/dL
Leukocytes, UA: NEGATIVE
Nitrite: NEGATIVE
Protein, ur: NEGATIVE mg/dL
Specific Gravity, Urine: 1.015 (ref 1.005–1.030)
pH: 5 (ref 5.0–8.0)

## 2016-11-16 LAB — COMPREHENSIVE METABOLIC PANEL
ALT: 18 U/L (ref 14–54)
AST: 15 U/L (ref 15–41)
Alkaline Phosphatase: 81 U/L (ref 38–126)
BUN: 18 mg/dL (ref 6–20)
Calcium: 9 mg/dL (ref 8.9–10.3)
Chloride: 102 mmol/L (ref 101–111)
Creatinine, Ser: 0.75 mg/dL (ref 0.44–1.00)
GFR calc non Af Amer: >60 mL/min (ref >60)
Total Bilirubin: 0.9 mg/dL (ref 0.3–1.2)

## 2016-11-16 LAB — I-STAT TROPONIN, ED: Troponin i, poc: 0 ng/mL (ref 0.00–0.08)

## 2016-11-16 LAB — COMPREHENSIVE METABOLIC PANEL WITH GFR
Albumin: 4.2 g/dL (ref 3.5–5.0)
Anion gap: 8 (ref 5–15)
CO2: 28 mmol/L (ref 22–32)
GFR calc Af Amer: >60 mL/min (ref >60)
Glucose, Bld: 108 mg/dL — ABNORMAL HIGH (ref 65–99)
Potassium: 3.6 mmol/L (ref 3.5–5.1)
Sodium: 138 mmol/L (ref 135–145)
Total Protein: 7.3 g/dL (ref 6.5–8.1)

## 2016-11-16 LAB — LIPASE, BLOOD: Lipase: 25 U/L (ref 11–51)

## 2016-11-16 MED ORDER — GI COCKTAIL ~~LOC~~
30.0000 mL | Freq: Once | ORAL | Status: AC
  Administered 2016-11-16: 30 mL via ORAL
  Filled 2016-11-16: qty 30

## 2016-11-16 NOTE — ED Provider Notes (Signed)
[facility] DEPT Provider Note   CSN: [id number] Arrival date & time: 11/15/16  2205  By signing my name below, I, Soijett Blue, attest that this documentation has been prepared under the direction and in the presence of Junius Creamer, NP Electronically Signed: Soijett Blue, ED Scribe. 11/16/16. 1:28 AM.  History   Chief Complaint Chief Complaint  Patient presents with  . Abdominal Pain    HPI Wendy Cain is a 60 y.o. female with a PMHx of GERD who presents to the Emergency Department complaining of epigastric abdominal pain onset 9 AM yesterday. Pt states that her epigastric abdominal pain radiates to her back. Pt notes that her symptoms began with nausea 30 minutes following taking a calcium pill. Pt denies being informed by her doctor that she would need a endoscopy to further assess her GERD issues. She states that she is having associated symptoms of nausea, SOB, and mild left sided CP. She states that she has tried nexium, TUMS, baking soda, and probiotic with no relief for her symptoms. She denies appetite change, fever, and any other symptoms.    The history is provided by the patient. No language interpreter was used.    Past Medical History:  Diagnosis Date  . Asthma    Hx bronchitis  . GERD (gastroesophageal reflux disease)   . PONV (postoperative nausea and vomiting)     Patient Active Problem List   Diagnosis Date Noted  . COUGH 03/04/2010    Past Surgical History:  Procedure Laterality Date  . ABDOMINAL HYSTERECTOMY    . COLONOSCOPY WITH PROPOFOL N/A 04/01/2016   Procedure: COLONOSCOPY WITH PROPOFOL;  Surgeon: Garlan Fair, MD;  Location: [facility] ENDOSCOPY;  Service: Endoscopy;  Laterality: N/A;  . KNEE SURGERY     left knee meniscus    OB History    No data available       Home Medications    Prior to Admission medications   Medication Sig Start Date End Date Taking? Authorizing Provider  Cholecalciferol (VITAMIN D-3 PO) Take 1 tablet by mouth  once a week.    Historical Provider, MD  famotidine (PEPCID) 20 MG tablet Take 20 mg by mouth daily as needed for heartburn or indigestion. Daily and as needed    Historical Provider, MD  Thiamine HCl (VITAMIN B-1 PO) Take 1 tablet by mouth daily.    Historical Provider, MD    Family History History reviewed. No pertinent family history.  Social History Social History  Substance Use Topics  . Smoking status: Never Smoker  . Smokeless tobacco: Never Used  . Alcohol use Yes     Comment: occassionally wine     Allergies   Patient has no known allergies.   Review of Systems Review of Systems  Constitutional: Negative for appetite change and fever.  Respiratory: Positive for shortness of breath.   Cardiovascular: Positive for chest pain (mild, left sided).  Gastrointestinal: Positive for abdominal pain (epigastric) and nausea.    Physical Exam Updated Vital Signs BP 152/62 (BP Location: Right Arm)   Pulse 87   Temp 98.2 F (36.8 C) (Oral)   Resp 18   Ht 5\' 4"  (1.626 m)   Wt 146 lb (66.2 kg)   SpO2 100%   BMI 25.06 kg/m   Physical Exam  Constitutional: She is oriented to person, place, and time. She appears well-developed and well-nourished. No distress.  HENT:  Head: Normocephalic and atraumatic.  Eyes: EOM are normal.  Neck: Neck supple.  Cardiovascular: Normal  rate, regular rhythm and normal heart sounds.  Exam reveals no gallop and no friction rub.   No murmur heard. Pulmonary/Chest: Effort normal and breath sounds normal. No respiratory distress. She has no wheezes. She has no rales.  Abdominal: She exhibits no distension. There is tenderness in the right upper quadrant and epigastric area.  Tenderness noted to epigastric and RUQ.  Musculoskeletal: Normal range of motion.  Neurological: She is alert and oriented to person, place, and time.  Skin: Skin is warm and dry.  Psychiatric: She has a normal mood and affect. Her behavior is normal.  Nursing note and  vitals reviewed.   ED Treatments / Results  DIAGNOSTIC STUDIES: Oxygen Saturation is 100% on RA, nl by my interpretation.    COORDINATION OF CARE: 1:25 AM Discussed treatment plan with pt at bedside which includes labs, UA, EKG, GI cocktail, and pt agreed to plan.   Labs (all labs ordered are listed, but only abnormal results are displayed) Labs Reviewed  COMPREHENSIVE METABOLIC PANEL - Abnormal; Notable for the following:       Result Value   Glucose, Bld 108 (*)    All other components within normal limits  CBC - Abnormal; Notable for the following:    WBC 11.3 (*)    All other components within normal limits  LIPASE, BLOOD  URINALYSIS, ROUTINE W REFLEX MICROSCOPIC    EKG  EKG Interpretation None      Procedures Procedures (including critical care time)  Medications Ordered in ED Medications - No data to display   Initial Impression / Assessment and Plan / ED Course  I have reviewed the triage vital signs and the nursing notes.  Pertinent labs & imaging results that were available during my care of the patient were reviewed by me and considered in my medical decision making (see chart for details).  Clinical Course    Patient was given IV fluid GI cocktail with total resolution of symptoms recommend that she follow-up with her PCP for further evaluation if she continues to have pain I recommend that she takes her Nexium on a regular basis she can add Mylanta, MiraLAX 2-3 times a day as well.    Final Clinical Impressions(s) / ED Diagnoses   Final diagnoses:  None    New Prescriptions New Prescriptions   No medications on file   I personally performed the services described in this documentation, which was scribed in my presence. The recorded information has been reviewed and is accurate.    Junius Creamer, NP 11/16/16 [id number]    Ripley Fraise, MD 11/16/16 [phone]

## 2016-11-16 NOTE — Discharge Instructions (Signed)
I recommend that you take your Nexium on a regular basis daily for the next 1-2 weeks he can also use over-the-counter Mylanta or Maalox or Tums 2-3 times a day. If her discomfort persists or gets worse please follow-up with your primary care physician or return to the emergency room for further evaluation

## 2016-11-21 ENCOUNTER — Ambulatory Visit
Admission: RE | Admit: 2016-11-21 | Discharge: 2016-11-21 | Disposition: A | Discharge status: Home / Self Care | Payer: 59 | Source: Ambulatory Visit | Attending: Obstetrics and Gynecology | Admitting: Obstetrics and Gynecology

## 2016-11-21 DIAGNOSIS — N644 Mastodynia: Secondary | ICD-10-CM

## 2017-03-02 DIAGNOSIS — D1801 Hemangioma of skin and subcutaneous tissue: Secondary | ICD-10-CM | POA: Diagnosis not present

## 2017-03-02 DIAGNOSIS — D225 Melanocytic nevi of trunk: Secondary | ICD-10-CM | POA: Diagnosis not present

## 2017-03-02 DIAGNOSIS — L719 Rosacea, unspecified: Secondary | ICD-10-CM | POA: Diagnosis not present

## 2017-04-22 DIAGNOSIS — J069 Acute upper respiratory infection, unspecified: Secondary | ICD-10-CM | POA: Diagnosis not present

## 2017-05-28 DIAGNOSIS — J45909 Unspecified asthma, uncomplicated: Secondary | ICD-10-CM | POA: Diagnosis not present

## 2017-06-04 DIAGNOSIS — R05 Cough: Secondary | ICD-10-CM | POA: Diagnosis not present

## 2017-10-02 DIAGNOSIS — Z23 Encounter for immunization: Secondary | ICD-10-CM | POA: Diagnosis not present

## 2017-10-02 DIAGNOSIS — Z13220 Encounter for screening for lipoid disorders: Secondary | ICD-10-CM | POA: Diagnosis not present

## 2017-10-02 DIAGNOSIS — Z0000 Encounter for general adult medical examination without abnormal findings: Secondary | ICD-10-CM | POA: Diagnosis not present

## 2017-10-09 DIAGNOSIS — R0982 Postnasal drip: Secondary | ICD-10-CM | POA: Diagnosis not present

## 2018-01-08 DIAGNOSIS — Z1231 Encounter for screening mammogram for malignant neoplasm of breast: Secondary | ICD-10-CM | POA: Diagnosis not present

## 2018-01-08 DIAGNOSIS — Z01419 Encounter for gynecological examination (general) (routine) without abnormal findings: Secondary | ICD-10-CM | POA: Diagnosis not present

## 2018-02-02 DIAGNOSIS — L570 Actinic keratosis: Secondary | ICD-10-CM | POA: Diagnosis not present

## 2018-03-03 DIAGNOSIS — L821 Other seborrheic keratosis: Secondary | ICD-10-CM | POA: Diagnosis not present

## 2018-03-03 DIAGNOSIS — D1801 Hemangioma of skin and subcutaneous tissue: Secondary | ICD-10-CM | POA: Diagnosis not present

## 2018-03-03 DIAGNOSIS — D225 Melanocytic nevi of trunk: Secondary | ICD-10-CM | POA: Diagnosis not present

## 2018-04-09 DIAGNOSIS — H10413 Chronic giant papillary conjunctivitis, bilateral: Secondary | ICD-10-CM | POA: Diagnosis not present

## 2018-09-01 DIAGNOSIS — Z23 Encounter for immunization: Secondary | ICD-10-CM | POA: Diagnosis not present

## 2018-09-04 DIAGNOSIS — L309 Dermatitis, unspecified: Secondary | ICD-10-CM | POA: Diagnosis not present

## 2018-10-22 ENCOUNTER — Other Ambulatory Visit: Payer: Self-pay | Admitting: Internal Medicine

## 2018-10-22 DIAGNOSIS — M8588 Other specified disorders of bone density and structure, other site: Secondary | ICD-10-CM | POA: Diagnosis not present

## 2018-10-22 DIAGNOSIS — Z13220 Encounter for screening for lipoid disorders: Secondary | ICD-10-CM | POA: Diagnosis not present

## 2018-10-22 DIAGNOSIS — Z0000 Encounter for general adult medical examination without abnormal findings: Secondary | ICD-10-CM | POA: Diagnosis not present

## 2018-12-10 DIAGNOSIS — J40 Bronchitis, not specified as acute or chronic: Secondary | ICD-10-CM | POA: Diagnosis not present

## 2018-12-10 DIAGNOSIS — Z20828 Contact with and (suspected) exposure to other viral communicable diseases: Secondary | ICD-10-CM | POA: Diagnosis not present

## 2018-12-31 ENCOUNTER — Ambulatory Visit
Admission: RE | Admit: 2018-12-31 | Discharge: 2018-12-31 | Disposition: A | Discharge status: Home / Self Care | Payer: 59 | Source: Ambulatory Visit | Attending: Internal Medicine | Admitting: Internal Medicine

## 2018-12-31 ENCOUNTER — Other Ambulatory Visit: Payer: Self-pay | Admitting: Obstetrics and Gynecology

## 2018-12-31 DIAGNOSIS — M8588 Other specified disorders of bone density and structure, other site: Secondary | ICD-10-CM

## 2018-12-31 DIAGNOSIS — M8589 Other specified disorders of bone density and structure, multiple sites: Secondary | ICD-10-CM | POA: Diagnosis not present

## 2018-12-31 DIAGNOSIS — Z780 Asymptomatic menopausal state: Secondary | ICD-10-CM | POA: Diagnosis not present

## 2018-12-31 DIAGNOSIS — Z1231 Encounter for screening mammogram for malignant neoplasm of breast: Secondary | ICD-10-CM

## 2019-01-14 DIAGNOSIS — Z6826 Body mass index (BMI) 26.0-26.9, adult: Secondary | ICD-10-CM | POA: Diagnosis not present

## 2019-01-14 DIAGNOSIS — Z01419 Encounter for gynecological examination (general) (routine) without abnormal findings: Secondary | ICD-10-CM | POA: Diagnosis not present

## 2019-01-28 ENCOUNTER — Ambulatory Visit
Admission: RE | Admit: 2019-01-28 | Discharge: 2019-01-28 | Disposition: A | Discharge status: Home / Self Care | Payer: 59 | Source: Ambulatory Visit | Attending: Obstetrics and Gynecology | Admitting: Obstetrics and Gynecology

## 2019-01-28 DIAGNOSIS — Z1231 Encounter for screening mammogram for malignant neoplasm of breast: Secondary | ICD-10-CM | POA: Diagnosis not present

## 2020-02-24 ENCOUNTER — Other Ambulatory Visit: Payer: Self-pay | Admitting: Internal Medicine

## 2020-02-24 DIAGNOSIS — Z1231 Encounter for screening mammogram for malignant neoplasm of breast: Secondary | ICD-10-CM

## 2020-03-12 ENCOUNTER — Other Ambulatory Visit: Payer: Self-pay

## 2020-03-12 ENCOUNTER — Ambulatory Visit
Admission: RE | Admit: 2020-03-12 | Discharge: 2020-03-12 | Disposition: A | Discharge status: Home / Self Care | Payer: Medicare Other | Source: Ambulatory Visit | Attending: Internal Medicine | Admitting: Internal Medicine

## 2020-03-12 DIAGNOSIS — Z1231 Encounter for screening mammogram for malignant neoplasm of breast: Secondary | ICD-10-CM

## 2021-01-02 ENCOUNTER — Other Ambulatory Visit: Payer: Self-pay | Admitting: Internal Medicine

## 2021-01-02 DIAGNOSIS — Z1231 Encounter for screening mammogram for malignant neoplasm of breast: Secondary | ICD-10-CM

## 2021-03-13 ENCOUNTER — Other Ambulatory Visit: Payer: Self-pay

## 2021-03-13 ENCOUNTER — Ambulatory Visit
Admission: RE | Admit: 2021-03-13 | Discharge: 2021-03-13 | Disposition: A | Discharge status: Home / Self Care | Payer: Medicare Other | Source: Ambulatory Visit | Attending: Internal Medicine | Admitting: Internal Medicine

## 2021-03-13 DIAGNOSIS — Z1231 Encounter for screening mammogram for malignant neoplasm of breast: Secondary | ICD-10-CM

## 2021-12-25 ENCOUNTER — Other Ambulatory Visit: Payer: Self-pay | Admitting: Internal Medicine

## 2021-12-25 DIAGNOSIS — M8588 Other specified disorders of bone density and structure, other site: Secondary | ICD-10-CM

## 2022-03-26 ENCOUNTER — Other Ambulatory Visit: Payer: Self-pay | Admitting: Internal Medicine

## 2022-03-26 DIAGNOSIS — Z1231 Encounter for screening mammogram for malignant neoplasm of breast: Secondary | ICD-10-CM

## 2022-04-02 ENCOUNTER — Ambulatory Visit
Admission: RE | Admit: 2022-04-02 | Discharge: 2022-04-02 | Disposition: A | Discharge status: Home / Self Care | Payer: Medicare Other | Source: Ambulatory Visit | Attending: Internal Medicine | Admitting: Internal Medicine

## 2022-04-02 DIAGNOSIS — Z1231 Encounter for screening mammogram for malignant neoplasm of breast: Secondary | ICD-10-CM

## 2022-04-14 DIAGNOSIS — L814 Other melanin hyperpigmentation: Secondary | ICD-10-CM | POA: Diagnosis not present

## 2022-04-14 DIAGNOSIS — L821 Other seborrheic keratosis: Secondary | ICD-10-CM | POA: Diagnosis not present

## 2022-04-14 DIAGNOSIS — L578 Other skin changes due to chronic exposure to nonionizing radiation: Secondary | ICD-10-CM | POA: Diagnosis not present

## 2022-04-14 DIAGNOSIS — L820 Inflamed seborrheic keratosis: Secondary | ICD-10-CM | POA: Diagnosis not present

## 2022-04-14 DIAGNOSIS — D225 Melanocytic nevi of trunk: Secondary | ICD-10-CM | POA: Diagnosis not present

## 2022-06-02 ENCOUNTER — Ambulatory Visit
Admission: RE | Admit: 2022-06-02 | Discharge: 2022-06-02 | Disposition: A | Discharge status: Home / Self Care | Payer: Medicare Other | Source: Ambulatory Visit | Attending: Internal Medicine | Admitting: Internal Medicine

## 2022-06-02 DIAGNOSIS — M8588 Other specified disorders of bone density and structure, other site: Secondary | ICD-10-CM

## 2022-06-02 DIAGNOSIS — Z780 Asymptomatic menopausal state: Secondary | ICD-10-CM | POA: Diagnosis not present

## 2022-06-02 DIAGNOSIS — M8589 Other specified disorders of bone density and structure, multiple sites: Secondary | ICD-10-CM | POA: Diagnosis not present

## 2022-08-02 IMAGING — MG MM DIGITAL SCREENING BILAT W/ TOMO AND CAD
6 of 10 series · 6 of 30 positions shown · non-contrast
Comparison: Previous exam(s).

CLINICAL DATA: Screening.

EXAM:
DIGITAL SCREENING BILATERAL MAMMOGRAM WITH TOMOSYNTHESIS AND CAD
TECHNIQUE: Bilateral screening digital craniocaudal and mediolateral oblique
mammograms were obtained. Bilateral screening digital breast
tomosynthesis was performed. The images were evaluated with
computer-aided detection.

[L CC synth-2D (1 of 2)]
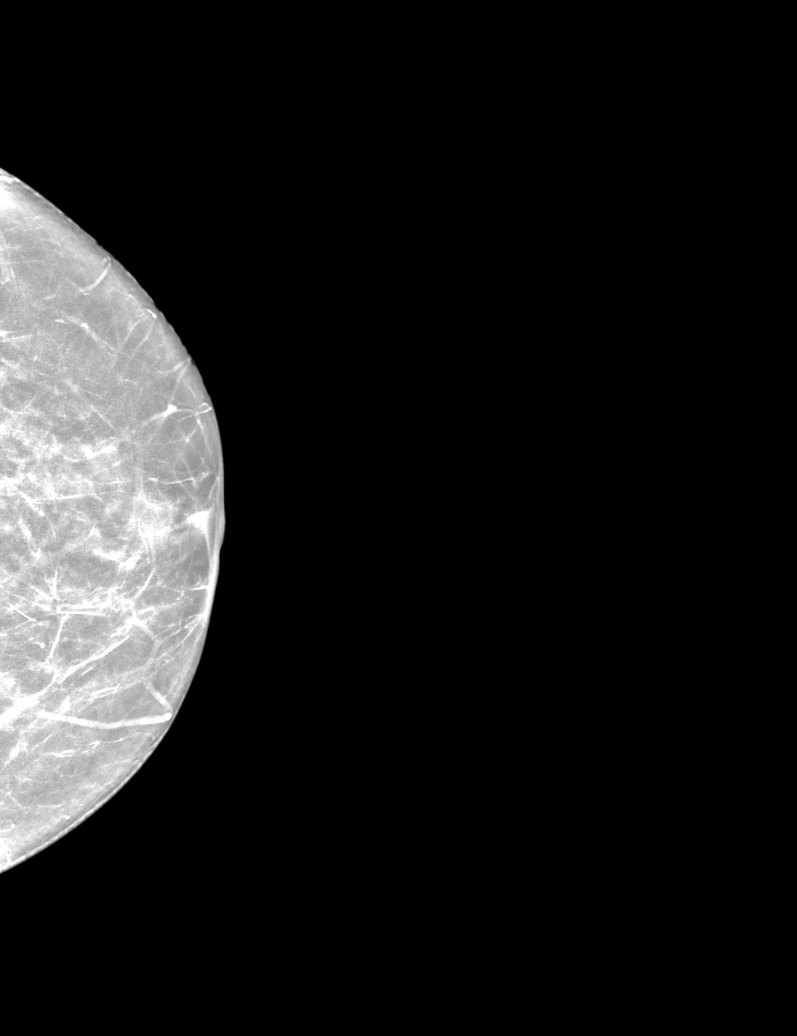

[R CC synth-2D]
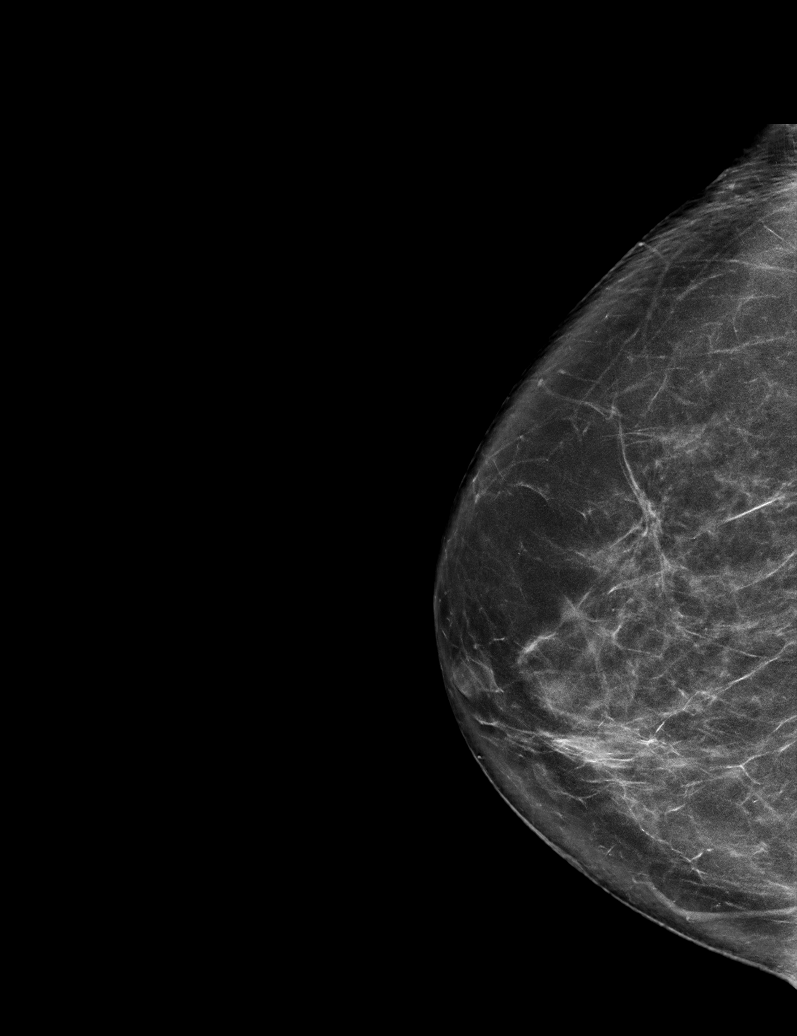

[R MLO synth-2D]
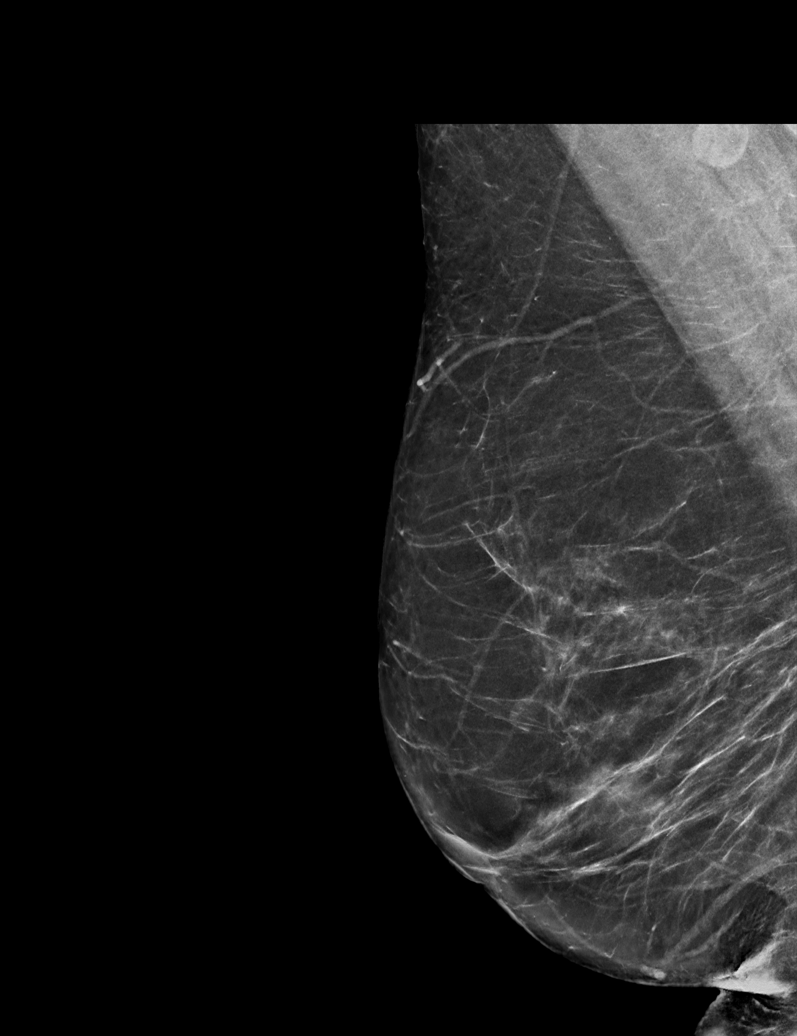

[L MLO synth-2D]
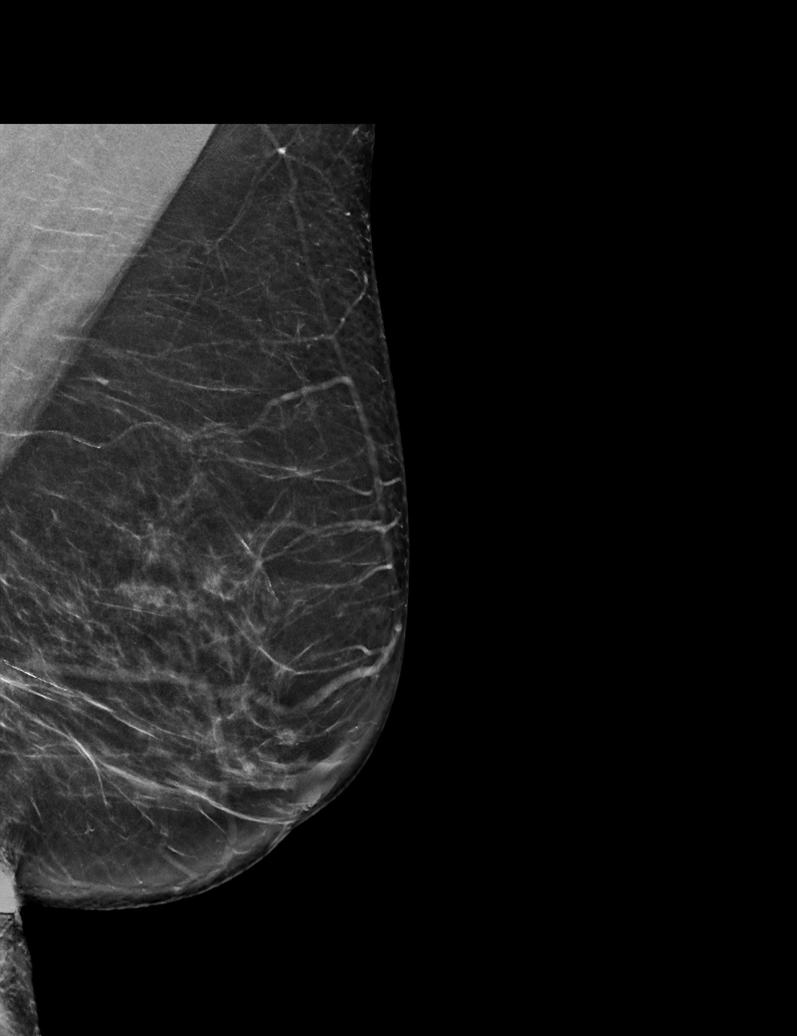

[L CC synth-2D (2 of 2)]
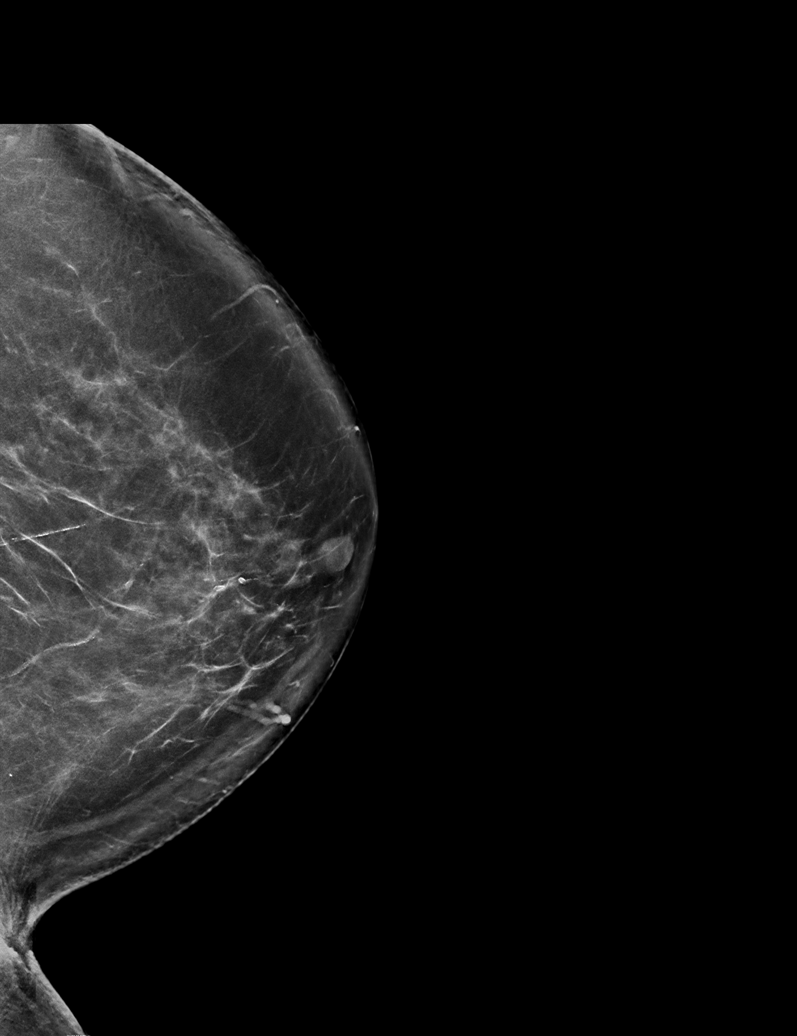

[R MLO tomo · tomo slice 39/76.0]
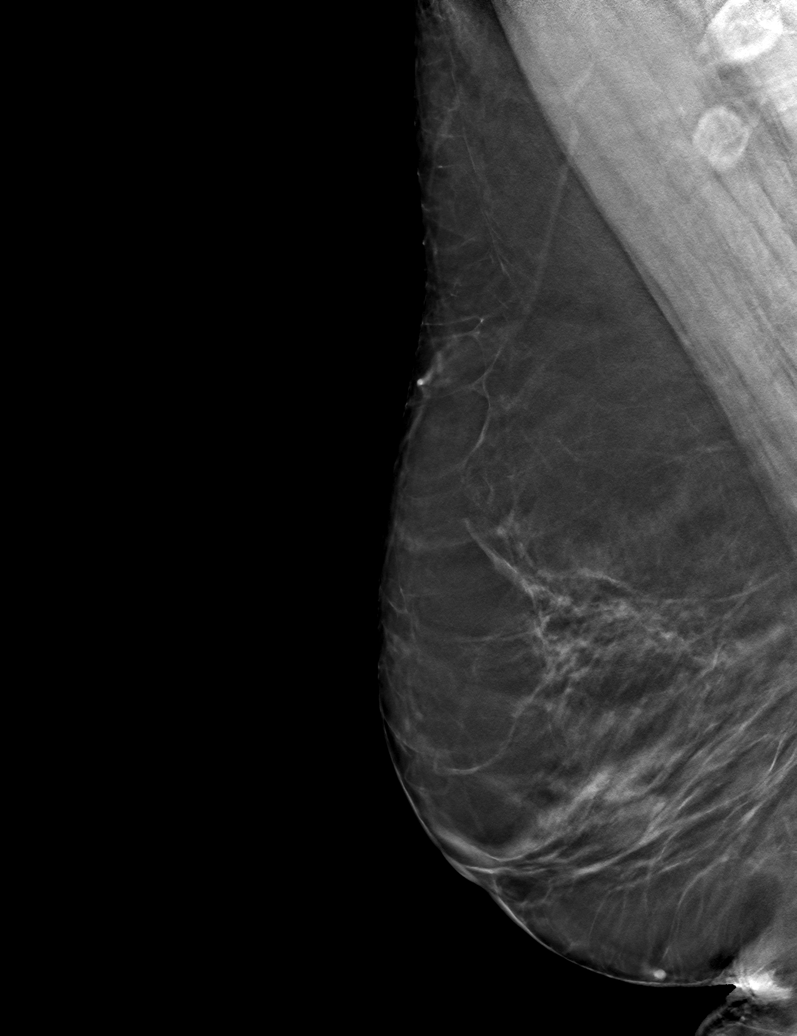

[6 of 30 positions shown; findings below may reference images not displayed]

ACR Breast Density Category b: There are scattered areas of
fibroglandular density.
FINDINGS: There are no findings suspicious for malignancy.
IMPRESSION: No mammographic evidence of malignancy. A result letter of this
screening mammogram will be mailed directly to the patient.

RECOMMENDATION:
Screening mammogram in one year. (Code:51-O-LD2)

BI-RADS CATEGORY  1: Negative.

## 2022-09-26 ENCOUNTER — Other Ambulatory Visit: Payer: Self-pay | Admitting: Internal Medicine

## 2022-09-26 DIAGNOSIS — N6310 Unspecified lump in the right breast, unspecified quadrant: Secondary | ICD-10-CM

## 2022-09-26 DIAGNOSIS — N63 Unspecified lump in unspecified breast: Secondary | ICD-10-CM | POA: Diagnosis not present

## 2022-10-08 ENCOUNTER — Ambulatory Visit
Admission: RE | Admit: 2022-10-08 | Discharge: 2022-10-08 | Disposition: A | Discharge status: Home / Self Care | Payer: Medicare Other | Source: Ambulatory Visit | Attending: Internal Medicine | Admitting: Internal Medicine

## 2022-10-08 DIAGNOSIS — R92321 Mammographic fibroglandular density, right breast: Secondary | ICD-10-CM | POA: Diagnosis not present

## 2022-10-08 DIAGNOSIS — N6310 Unspecified lump in the right breast, unspecified quadrant: Secondary | ICD-10-CM | POA: Diagnosis not present

## 2022-10-08 DIAGNOSIS — N6312 Unspecified lump in the right breast, upper inner quadrant: Secondary | ICD-10-CM | POA: Diagnosis not present

## 2022-10-08 DIAGNOSIS — N6314 Unspecified lump in the right breast, lower inner quadrant: Secondary | ICD-10-CM | POA: Diagnosis not present

## 2022-12-05 DIAGNOSIS — R051 Acute cough: Secondary | ICD-10-CM | POA: Diagnosis not present

## 2022-12-10 DIAGNOSIS — R030 Elevated blood-pressure reading, without diagnosis of hypertension: Secondary | ICD-10-CM | POA: Diagnosis not present

## 2023-01-07 DIAGNOSIS — R030 Elevated blood-pressure reading, without diagnosis of hypertension: Secondary | ICD-10-CM | POA: Diagnosis not present

## 2023-01-07 DIAGNOSIS — R002 Palpitations: Secondary | ICD-10-CM | POA: Diagnosis not present

## 2023-01-07 DIAGNOSIS — R42 Dizziness and giddiness: Secondary | ICD-10-CM | POA: Diagnosis not present

## 2023-01-22 DIAGNOSIS — R002 Palpitations: Secondary | ICD-10-CM | POA: Diagnosis not present

## 2023-02-18 DIAGNOSIS — R42 Dizziness and giddiness: Secondary | ICD-10-CM | POA: Diagnosis not present

## 2023-02-18 DIAGNOSIS — R002 Palpitations: Secondary | ICD-10-CM | POA: Diagnosis not present

## 2023-02-18 DIAGNOSIS — I10 Essential (primary) hypertension: Secondary | ICD-10-CM | POA: Diagnosis not present

## 2023-03-03 DIAGNOSIS — R002 Palpitations: Secondary | ICD-10-CM | POA: Diagnosis not present

## 2023-03-25 DIAGNOSIS — H2513 Age-related nuclear cataract, bilateral: Secondary | ICD-10-CM | POA: Diagnosis not present

## 2023-03-25 DIAGNOSIS — H524 Presbyopia: Secondary | ICD-10-CM | POA: Diagnosis not present

## 2023-03-25 DIAGNOSIS — H0012 Chalazion right lower eyelid: Secondary | ICD-10-CM | POA: Diagnosis not present

## 2023-03-25 DIAGNOSIS — H25013 Cortical age-related cataract, bilateral: Secondary | ICD-10-CM | POA: Diagnosis not present

## 2023-03-25 DIAGNOSIS — H04123 Dry eye syndrome of bilateral lacrimal glands: Secondary | ICD-10-CM | POA: Diagnosis not present

## 2023-03-25 DIAGNOSIS — H5203 Hypermetropia, bilateral: Secondary | ICD-10-CM | POA: Diagnosis not present

## 2023-04-03 ENCOUNTER — Other Ambulatory Visit: Payer: Self-pay | Admitting: Internal Medicine

## 2023-04-03 DIAGNOSIS — Z1231 Encounter for screening mammogram for malignant neoplasm of breast: Secondary | ICD-10-CM

## 2023-04-06 DIAGNOSIS — L259 Unspecified contact dermatitis, unspecified cause: Secondary | ICD-10-CM | POA: Diagnosis not present

## 2023-04-21 DIAGNOSIS — H0012 Chalazion right lower eyelid: Secondary | ICD-10-CM | POA: Diagnosis not present

## 2023-05-06 ENCOUNTER — Ambulatory Visit
Admission: RE | Admit: 2023-05-06 | Discharge: 2023-05-06 | Disposition: A | Discharge status: Home / Self Care | Payer: Medicare Other | Source: Ambulatory Visit | Attending: Internal Medicine | Admitting: Internal Medicine

## 2023-05-06 DIAGNOSIS — Z1231 Encounter for screening mammogram for malignant neoplasm of breast: Secondary | ICD-10-CM | POA: Diagnosis not present

## 2023-05-06 DIAGNOSIS — I10 Essential (primary) hypertension: Secondary | ICD-10-CM | POA: Diagnosis not present

## 2023-05-06 DIAGNOSIS — E7800 Pure hypercholesterolemia, unspecified: Secondary | ICD-10-CM | POA: Diagnosis not present

## 2023-05-06 DIAGNOSIS — J45909 Unspecified asthma, uncomplicated: Secondary | ICD-10-CM | POA: Diagnosis not present

## 2023-05-06 DIAGNOSIS — Z5181 Encounter for therapeutic drug level monitoring: Secondary | ICD-10-CM | POA: Diagnosis not present

## 2023-05-06 DIAGNOSIS — Z0000 Encounter for general adult medical examination without abnormal findings: Secondary | ICD-10-CM | POA: Diagnosis not present

## 2023-07-01 DIAGNOSIS — L818 Other specified disorders of pigmentation: Secondary | ICD-10-CM | POA: Diagnosis not present

## 2023-07-01 DIAGNOSIS — D225 Melanocytic nevi of trunk: Secondary | ICD-10-CM | POA: Diagnosis not present

## 2023-07-01 DIAGNOSIS — L578 Other skin changes due to chronic exposure to nonionizing radiation: Secondary | ICD-10-CM | POA: Diagnosis not present

## 2023-07-01 DIAGNOSIS — L814 Other melanin hyperpigmentation: Secondary | ICD-10-CM | POA: Diagnosis not present

## 2023-07-01 DIAGNOSIS — L821 Other seborrheic keratosis: Secondary | ICD-10-CM | POA: Diagnosis not present

## 2023-09-02 DIAGNOSIS — Z23 Encounter for immunization: Secondary | ICD-10-CM | POA: Diagnosis not present

## 2023-10-05 DIAGNOSIS — D485 Neoplasm of uncertain behavior of skin: Secondary | ICD-10-CM | POA: Diagnosis not present

## 2023-10-05 DIAGNOSIS — L570 Actinic keratosis: Secondary | ICD-10-CM | POA: Diagnosis not present

## 2023-10-05 DIAGNOSIS — L986 Other infiltrative disorders of the skin and subcutaneous tissue: Secondary | ICD-10-CM | POA: Diagnosis not present

## 2023-10-05 DIAGNOSIS — L821 Other seborrheic keratosis: Secondary | ICD-10-CM | POA: Diagnosis not present

## 2023-10-05 DIAGNOSIS — S61101A Unspecified open wound of right thumb with damage to nail, initial encounter: Secondary | ICD-10-CM | POA: Diagnosis not present

## 2023-10-14 DIAGNOSIS — S6991XD Unspecified injury of right wrist, hand and finger(s), subsequent encounter: Secondary | ICD-10-CM | POA: Diagnosis not present

## 2023-12-08 DIAGNOSIS — L570 Actinic keratosis: Secondary | ICD-10-CM | POA: Diagnosis not present

## 2023-12-31 DIAGNOSIS — Z03818 Encounter for observation for suspected exposure to other biological agents ruled out: Secondary | ICD-10-CM | POA: Diagnosis not present

## 2023-12-31 DIAGNOSIS — R059 Cough, unspecified: Secondary | ICD-10-CM | POA: Diagnosis not present

## 2024-04-04 DIAGNOSIS — Z23 Encounter for immunization: Secondary | ICD-10-CM | POA: Diagnosis not present

## 2024-04-04 DIAGNOSIS — R21 Rash and other nonspecific skin eruption: Secondary | ICD-10-CM | POA: Diagnosis not present

## 2024-06-21 ENCOUNTER — Other Ambulatory Visit: Payer: Self-pay | Admitting: Internal Medicine

## 2024-06-21 DIAGNOSIS — Z1231 Encounter for screening mammogram for malignant neoplasm of breast: Secondary | ICD-10-CM

## 2024-06-30 DIAGNOSIS — L578 Other skin changes due to chronic exposure to nonionizing radiation: Secondary | ICD-10-CM | POA: Diagnosis not present

## 2024-06-30 DIAGNOSIS — D225 Melanocytic nevi of trunk: Secondary | ICD-10-CM | POA: Diagnosis not present

## 2024-06-30 DIAGNOSIS — L821 Other seborrheic keratosis: Secondary | ICD-10-CM | POA: Diagnosis not present

## 2024-06-30 DIAGNOSIS — L814 Other melanin hyperpigmentation: Secondary | ICD-10-CM | POA: Diagnosis not present

## 2024-07-06 ENCOUNTER — Other Ambulatory Visit (HOSPITAL_BASED_OUTPATIENT_CLINIC_OR_DEPARTMENT_OTHER): Payer: Self-pay | Admitting: Internal Medicine

## 2024-07-06 DIAGNOSIS — Z5181 Encounter for therapeutic drug level monitoring: Secondary | ICD-10-CM | POA: Diagnosis not present

## 2024-07-06 DIAGNOSIS — M8580 Other specified disorders of bone density and structure, unspecified site: Secondary | ICD-10-CM

## 2024-07-06 DIAGNOSIS — Z0000 Encounter for general adult medical examination without abnormal findings: Secondary | ICD-10-CM | POA: Diagnosis not present

## 2024-07-06 DIAGNOSIS — J45991 Cough variant asthma: Secondary | ICD-10-CM | POA: Diagnosis not present

## 2024-07-06 DIAGNOSIS — Z23 Encounter for immunization: Secondary | ICD-10-CM | POA: Diagnosis not present

## 2024-07-06 DIAGNOSIS — E7800 Pure hypercholesterolemia, unspecified: Secondary | ICD-10-CM | POA: Diagnosis not present

## 2024-07-06 DIAGNOSIS — Z1331 Encounter for screening for depression: Secondary | ICD-10-CM | POA: Diagnosis not present

## 2024-07-12 ENCOUNTER — Ambulatory Visit
Admission: RE | Admit: 2024-07-12 | Discharge: 2024-07-12 | Disposition: A | Discharge status: Home / Self Care | Source: Ambulatory Visit | Attending: Internal Medicine | Admitting: Internal Medicine

## 2024-07-12 DIAGNOSIS — Z1231 Encounter for screening mammogram for malignant neoplasm of breast: Secondary | ICD-10-CM | POA: Diagnosis not present

## 2024-07-20 DIAGNOSIS — H524 Presbyopia: Secondary | ICD-10-CM | POA: Diagnosis not present

## 2024-09-28 DIAGNOSIS — R252 Cramp and spasm: Secondary | ICD-10-CM | POA: Diagnosis not present

## 2024-09-28 DIAGNOSIS — I8393 Asymptomatic varicose veins of bilateral lower extremities: Secondary | ICD-10-CM | POA: Diagnosis not present

## 2024-09-28 DIAGNOSIS — Z23 Encounter for immunization: Secondary | ICD-10-CM | POA: Diagnosis not present

## 2025-03-01 ENCOUNTER — Other Ambulatory Visit (HOSPITAL_BASED_OUTPATIENT_CLINIC_OR_DEPARTMENT_OTHER)
